# Patient Record
Sex: Male | Born: 1995 | Race: White | Hispanic: No | Marital: Single
Health system: Southern US, Community
[De-identification: ages and names within clinical notes are randomized; demographics above are authoritative.]

---

## 2019-03-04 ENCOUNTER — Emergency Department (HOSPITAL_COMMUNITY)
Admission: EM | Admit: 2019-03-04 | Discharge: 2019-03-04 | Disposition: A | Discharge status: Home / Self Care | Payer: No Typology Code available for payment source | Attending: Emergency Medicine | Admitting: Emergency Medicine

## 2019-03-04 ENCOUNTER — Emergency Department (HOSPITAL_COMMUNITY): Payer: No Typology Code available for payment source

## 2019-03-04 DIAGNOSIS — S0990XA Unspecified injury of head, initial encounter: Secondary | ICD-10-CM | POA: Diagnosis present

## 2019-03-04 DIAGNOSIS — S139XXA Sprain of joints and ligaments of unspecified parts of neck, initial encounter: Secondary | ICD-10-CM

## 2019-03-04 DIAGNOSIS — S335XXA Sprain of ligaments of lumbar spine, initial encounter: Secondary | ICD-10-CM

## 2019-03-04 DIAGNOSIS — Y999 Unspecified external cause status: Secondary | ICD-10-CM | POA: Diagnosis not present

## 2019-03-04 DIAGNOSIS — Y939 Activity, unspecified: Secondary | ICD-10-CM | POA: Insufficient documentation

## 2019-03-04 DIAGNOSIS — S39012A Strain of muscle, fascia and tendon of lower back, initial encounter: Secondary | ICD-10-CM | POA: Insufficient documentation

## 2019-03-04 DIAGNOSIS — Y929 Unspecified place or not applicable: Secondary | ICD-10-CM | POA: Diagnosis not present

## 2019-03-04 MED ORDER — CYCLOBENZAPRINE HCL 10 MG PO TABS
10.0000 mg | ORAL_TABLET | Freq: Once | ORAL | Status: AC
  Administered 2019-03-04: 10 mg via ORAL
  Filled 2019-03-04: qty 1

## 2019-03-04 MED ORDER — CYCLOBENZAPRINE HCL 10 MG PO TABS: 10.0000 mg | ORAL_TABLET | Freq: Two times a day (BID) | ORAL | 0 refills | Status: AC | PRN

## 2019-03-04 MED ORDER — KETOROLAC TROMETHAMINE 60 MG/2ML IM SOLN
30.0000 mg | Freq: Once | INTRAMUSCULAR | Status: AC
  Administered 2019-03-04: 30 mg via INTRAMUSCULAR
  Filled 2019-03-04: qty 2

## 2019-03-04 MED ORDER — NAPROXEN 500 MG PO TABS: 500.0000 mg | ORAL_TABLET | Freq: Two times a day (BID) | ORAL | 0 refills | Status: AC

## 2019-03-04 NOTE — ED Notes (Signed)
EDP at bedside  
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
## 2019-03-04 NOTE — ED Notes (Signed)
Patient transported to CT 
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
## 2019-03-04 NOTE — ED Notes (Signed)
Discharge instructions and prescriptions discussed with pt. Pt verbalized understanding. Pt stable and ambulatory.

## 2019-03-04 NOTE — Discharge Instructions (Signed)
You may be sore for the next several days. Apply ice over a washcloth to injured areas for 20 minutes at a time, 3x a day. Take medications as prescribed.   Thank you for allowing me to care for you today. Please return to the emergency department if you have new or worsening symptoms. Take your medications as instructed.
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
